# Patient Record
Sex: Female | Born: 1960 | Race: White | Hispanic: No | Marital: Single | Smoking: Former smoker
Health system: Southern US, Community
[De-identification: ages and names within clinical notes are randomized; demographics above are authoritative.]

## PROBLEM LIST (undated history)

## (undated) DIAGNOSIS — M5090 Cervical disc disorder, unspecified, unspecified cervical region: Secondary | ICD-10-CM

## (undated) HISTORY — PX: ANAL FISSURE REPAIR: SHX2312

## (undated) HISTORY — PX: NECK SURGERY: SHX720

## (undated) HISTORY — PX: TONSILLECTOMY: SUR1361

---

## 2011-03-11 ENCOUNTER — Ambulatory Visit: Payer: Self-pay

## 2012-01-28 ENCOUNTER — Ambulatory Visit: Payer: Self-pay

## 2012-02-11 ENCOUNTER — Ambulatory Visit: Payer: Self-pay

## 2012-09-28 ENCOUNTER — Ambulatory Visit: Payer: Self-pay | Admitting: Family Medicine

## 2013-07-15 ENCOUNTER — Ambulatory Visit: Payer: Self-pay | Admitting: Physician Assistant

## 2015-01-06 ENCOUNTER — Ambulatory Visit
Admission: EM | Admit: 2015-01-06 | Discharge: 2015-01-06 | Disposition: A | Discharge status: Home / Self Care | Payer: Medicare Other | Attending: Internal Medicine | Admitting: Internal Medicine

## 2015-01-06 ENCOUNTER — Encounter: Payer: Self-pay | Admitting: Emergency Medicine

## 2015-01-06 DIAGNOSIS — Z825 Family history of asthma and other chronic lower respiratory diseases: Secondary | ICD-10-CM

## 2015-01-06 DIAGNOSIS — J209 Acute bronchitis, unspecified: Secondary | ICD-10-CM

## 2015-01-06 DIAGNOSIS — F17200 Nicotine dependence, unspecified, uncomplicated: Secondary | ICD-10-CM

## 2015-01-06 DIAGNOSIS — H6693 Otitis media, unspecified, bilateral: Secondary | ICD-10-CM

## 2015-01-06 HISTORY — DX: Cervical disc disorder, unspecified, unspecified cervical region: M50.90

## 2015-01-06 MED ORDER — ALBUTEROL SULFATE HFA 108 (90 BASE) MCG/ACT IN AERS: 1.0000 | INHALATION_SPRAY | Freq: Four times a day (QID) | RESPIRATORY_TRACT | Status: DC | PRN

## 2015-01-06 MED ORDER — IPRATROPIUM-ALBUTEROL 0.5-2.5 (3) MG/3ML IN SOLN
3.0000 mL | Freq: Once | RESPIRATORY_TRACT | Status: AC
  Administered 2015-01-06: 3 mL via RESPIRATORY_TRACT

## 2015-01-06 MED ORDER — CEFDINIR 300 MG PO CAPS: 300.0000 mg | ORAL_CAPSULE | Freq: Two times a day (BID) | ORAL | Status: DC

## 2015-01-06 MED ORDER — PREDNISONE 50 MG PO TABS: 50.0000 mg | ORAL_TABLET | Freq: Every day | ORAL | Status: DC

## 2015-01-06 NOTE — ED Provider Notes (Signed)
CSN: [id number]     Arrival date & time 01/06/15  1756 History   First MD Initiated Contact with Patient 01/06/15 1840     Chief Complaint  Patient presents with  . URI   HPI Patient is a 54 year old lady presents today with about a 12 day history of respiratory symptoms. She started with profuse runny nose, some sinus congestion. In the last couple of days, she has developed chest tightness and wheezing. Tactile temp, but no recorded fever. Malaise. She has tried to quit smoking multiple times. Her sister was diagnosed yesterday with emphysema.  Past Medical History  Diagnosis Date  . Cervical back pain with evidence of disc disease    Past Surgical History  Procedure Laterality Date  . Tonsillectomy    . Cesarean section    . Anal fissure repair    . Neck surgery     Family History  Problem Relation Age of Onset  . Cancer Mother   . Cancer Father    Social History  Substance Use Topics  . Smoking status: Current Every Day Smoker  . Smokeless tobacco: Never Used  . Alcohol Use: No   OB History    No data available     Review of Systems  Allergies  Review of patient's allergies indicates no known allergies.  Home Medications   Prior to Admission medications   Medication Sig Start Date End Date Taking? Authorizing Provider  baclofen (LIORESAL) 10 MG tablet Take 10 mg by mouth 2 (two) times daily.   Yes Historical Provider, MD  HYDROcodone-acetaminophen (NORCO) 10-325 MG per tablet Take 1 tablet by mouth every 4 (four) hours as needed.   Yes Historical Provider, MD  ibuprofen (ADVIL,MOTRIN) 600 MG tablet Take 600 mg by mouth every 6 (six) hours as needed.   Yes Historical Provider, MD  ibuprofen (ADVIL,MOTRIN) 800 MG tablet Take 800 mg by mouth every 8 (eight) hours as needed.   Yes Historical Provider, MD  morphine (MS CONTIN) 30 MG 12 hr tablet Take 30 mg by mouth every 12 (twelve) hours.   Yes Historical Provider, MD   BP 119/73 mmHg  Pulse 87  Temp(Src) 98.9 F  (37.2 C) (Tympanic)  Resp 18  Ht 5\' 6"  (1.676 m)  Wt 127 lb (57.607 kg)  BMI 20.51 kg/m2  SpO2 95% Physical Exam  Constitutional: She is oriented to person, place, and time. No distress.  Alert, nicely groomed  HENT:  Head: Atraumatic.  Bilateral TMs are red, dull Moderate nasal congestion Throat is slightly red  Eyes:  Conjugate gaze, no eye redness/drainage  Neck: Neck supple.  Cardiovascular: Normal rate and regular rhythm.   Pulmonary/Chest: No respiratory distress. She has wheezes. She has no rales.  Coarse breath sounds throughout, symmetric, with coarse wheezes throughout.  Abdominal: She exhibits no distension.  Musculoskeletal: Normal range of motion.  No leg swelling  Neurological: She is alert and oriented to person, place, and time.  Skin: Skin is warm and dry.  No cyanosis  Nursing note and vitals reviewed.   ED Course  Procedures  Medications  ipratropium-albuterol (DUONEB) 0.5-2.5 (3) MG/3ML nebulizer solution 3 mL (3 mLs Nebulization Given 01/06/15 1901)       MDM   1. Acute bronchitis, unspecified organism   2. Bilateral otitis media, recurrence not specified, unspecified chronicity, unspecified otitis media type   3. Smoker   4. Family history of emphysema    We discussed chest x-ray now, versus chest x-ray if not improving after  treatment that would be effective for bronchitis or pneumonia. She has had multiple chest x-rays, most recently in the last 6 months.  Prescriptions as below sent to the pharmacy. Patient will recheck if not improving, for chest x-ray.  Discharge Medication List as of 01/06/2015  7:19 PM    START taking these medications   Details  albuterol (PROVENTIL HFA;VENTOLIN HFA) 108 (90 BASE) MCG/ACT inhaler Inhale 1-2 puffs into the lungs every 6 (six) hours as needed for wheezing or shortness of breath., Starting 01/06/2015, Until Discontinued, Normal    cefdinir (OMNICEF) 300 MG capsule Take 1 capsule (300 mg total) by mouth 2  (two) times daily., Starting 01/06/2015, Until Discontinued, Normal    predniSONE (DELTASONE) 50 MG tablet Take 1 tablet (50 mg total) by mouth daily., Starting 01/06/2015, Until Discontinued, Normal          Eustace Moore, MD 01/07/15 2130

## 2015-01-06 NOTE — Discharge Instructions (Signed)
Prescriptions for prednisone, omnicef (cefdinir, an antibiotic), and  albuterol inhaler, were sent to the [facility] in [place]. Recheck if not starting to improve in 2-3 days, for new fever >100.5, or increasing phlegm.   Anticipate gradual improvement over several days. Some cough may persist for another 2-3 weeks.  Acute Bronchitis Bronchitis is when the airways that extend from the windpipe into the lungs get red, puffy, and painful (inflamed). Bronchitis often causes thick spit (mucus) to develop. This leads to a cough. A cough is the most common symptom of bronchitis. In acute bronchitis, the condition usually begins suddenly and goes away over time (usually in 2 weeks). Smoking, allergies, and asthma can make bronchitis worse. Repeated episodes of bronchitis may cause more lung problems. HOME CARE  Rest.  Drink enough fluids to keep your pee (urine) clear or pale yellow (unless you need to limit fluids as told by your doctor).  Only take over-the-counter or prescription medicines as told by your doctor.  Avoid smoking and secondhand smoke. These can make bronchitis worse. If you are a smoker, think about using nicotine gum or skin patches. Quitting smoking will help your lungs heal faster.  Reduce the chance of getting bronchitis again by:  Washing your hands often.  Avoiding people with cold symptoms.  Trying not to touch your hands to your mouth, nose, or eyes.  Follow up with your doctor as told. GET HELP IF: Your symptoms do not improve after 1 week of treatment. Symptoms include:  Cough.  Fever.  Coughing up thick spit.  Body aches.  Chest congestion.  Chills.  Shortness of breath.  Sore throat. GET HELP RIGHT AWAY IF:   You have an increased fever.  You have chills.  You have severe shortness of breath.  You have bloody thick spit (sputum).  You throw up (vomit) often.  You lose too much body fluid (dehydration).  You have a severe headache.  You  faint. MAKE SURE YOU:   Understand these instructions.  Will watch your condition.  Will get help right away if you are not doing well or get worse. Document Released: 11/01/2007 Document Revised: 01/15/2013 Document Reviewed: 11/05/2012 [facility] Patient Information 2015 [place], [place]. This information is not intended to replace advice given to you by your health care provider. Make sure you discuss any questions you have with your health care provider.

## 2015-01-06 NOTE — ED Notes (Signed)
Cough, chest congestion, bodyaches on and off for 12 days

## 2017-09-30 ENCOUNTER — Encounter: Payer: Self-pay | Admitting: Gynecology

## 2017-09-30 ENCOUNTER — Ambulatory Visit
Admission: EM | Admit: 2017-09-30 | Discharge: 2017-09-30 | Disposition: A | Discharge status: Home / Self Care | Payer: Medicare Other | Attending: Family Medicine | Admitting: Family Medicine

## 2017-09-30 ENCOUNTER — Ambulatory Visit (INDEPENDENT_AMBULATORY_CARE_PROVIDER_SITE_OTHER): Payer: Medicare Other

## 2017-09-30 DIAGNOSIS — R05 Cough: Secondary | ICD-10-CM

## 2017-09-30 DIAGNOSIS — J189 Pneumonia, unspecified organism: Secondary | ICD-10-CM

## 2017-09-30 DIAGNOSIS — J181 Lobar pneumonia, unspecified organism: Secondary | ICD-10-CM | POA: Diagnosis not present

## 2017-09-30 DIAGNOSIS — J9801 Acute bronchospasm: Secondary | ICD-10-CM

## 2017-09-30 MED ORDER — ALBUTEROL SULFATE HFA 108 (90 BASE) MCG/ACT IN AERS: 1.0000 | INHALATION_SPRAY | Freq: Four times a day (QID) | RESPIRATORY_TRACT | 0 refills | Status: DC | PRN

## 2017-09-30 MED ORDER — IPRATROPIUM-ALBUTEROL 0.5-2.5 (3) MG/3ML IN SOLN
3.0000 mL | Freq: Once | RESPIRATORY_TRACT | Status: AC
  Administered 2017-09-30: 3 mL via RESPIRATORY_TRACT

## 2017-09-30 MED ORDER — LEVOFLOXACIN 750 MG PO TABS: 750.0000 mg | ORAL_TABLET | Freq: Every day | ORAL | 0 refills | Status: DC

## 2017-09-30 NOTE — Discharge Instructions (Signed)
Follow up with PCP this week Go to Emergency Department if symptoms worsen

## 2017-09-30 NOTE — ED Triage Notes (Signed)
Chest congestion since Friday, states she is prone to pneumonia. Productive cough with yellow/brown mucus, chest pain in the center of her chest and said she has coughed so much she has pulled a muscle in her back. Did try alka seltzer cough and cold along with ibuprofen.

## 2017-09-30 NOTE — ED Provider Notes (Addendum)
[facility]    CSN: [id number] Arrival date & time: 09/30/17  1313     History   Chief Complaint Chief Complaint  Patient presents with  . URI    HPI Savannah Mccarthy is a 57 y.o. female.   The history is provided by the patient.  URI  Presenting symptoms: congestion, cough, fatigue and rhinorrhea   Severity:  Moderate Onset quality:  Sudden Duration:  1 week Timing:  Constant Progression:  Worsening Chronicity:  New Relieved by:  Nothing Ineffective treatments:  OTC medications Associated symptoms: no wheezing   Associated symptoms comment:  Denies shortness of breath Risk factors: chronic respiratory disease (patient states her oxygen normally runs 92-93%)     Past Medical History:  Diagnosis Date  . Cervical back pain with evidence of disc disease     There are no active problems to display for this patient.   Past Surgical History:  Procedure Laterality Date  . ANAL FISSURE REPAIR    . CESAREAN SECTION    . NECK SURGERY    . TONSILLECTOMY      OB History   None      Home Medications    Prior to Admission medications   Medication Sig Start Date End Date Taking? Authorizing Provider  baclofen (LIORESAL) 10 MG tablet Take 10 mg by mouth 2 (two) times daily.   Yes [provider]  ibuprofen (ADVIL,MOTRIN) 600 MG tablet Take 600 mg by mouth every 6 (six) hours as needed.   Yes [provider]  morphine (MS CONTIN) 30 MG 12 hr tablet Take 30 mg by mouth every 12 (twelve) hours.   Yes [provider]  albuterol (PROVENTIL HFA;VENTOLIN HFA) 108 (90 Base) MCG/ACT inhaler Inhale 1-2 puffs into the lungs every 6 (six) hours as needed for wheezing or shortness of breath. 09/30/17   Payton Mccallum, MD  cefdinir (OMNICEF) 300 MG capsule Take 1 capsule (300 mg total) by mouth 2 (two) times daily. 01/06/15   Isa Rankin, MD  HYDROcodone-acetaminophen [facility]) 10-325 MG per tablet Take 1 tablet by mouth every 4 (four)  hours as needed.    [provider]  ibuprofen (ADVIL,MOTRIN) 800 MG tablet Take 800 mg by mouth every 8 (eight) hours as needed.    [provider]  levofloxacin (LEVAQUIN) 750 MG tablet Take 1 tablet (750 mg total) by mouth daily. 09/30/17   Payton Mccallum, MD  oxyCODONE-acetaminophen (PERCOCET) 10-325 MG tablet Take by mouth.    [provider]  predniSONE (DELTASONE) 50 MG tablet Take 1 tablet (50 mg total) by mouth daily. 01/06/15   Isa Rankin, MD    Family History Family History  Problem Relation Age of Onset  . Cancer Mother   . Cancer Father     Social History Social History   Tobacco Use  . Smoking status: Current Every Day Smoker  . Smokeless tobacco: Never Used  Substance Use Topics  . Alcohol use: No  . Drug use: No     Allergies   Patient has no known allergies.   Review of Systems Review of Systems  Constitutional: Positive for fatigue.  HENT: Positive for congestion and rhinorrhea.   Respiratory: Positive for cough. Negative for wheezing.      Physical Exam Triage Vital Signs ED Triage Vitals  Enc Vitals Group     BP 09/30/17 1332 113/84     Pulse Rate 09/30/17 1332 74     Resp 09/30/17 1332 18  Temp 09/30/17 1332 98 F (36.7 C)     Temp Source 09/30/17 1332 Oral     SpO2 09/30/17 1332 91 %     Weight --      Height --      Head Circumference --      Peak Flow --      Pain Score 09/30/17 1335 5     Pain Loc --      Pain Edu? --      Excl. in GC? --    No data found.  Updated Vital Signs BP 113/84 (BP Location: Left Arm)   Pulse 74   Temp 98 F (36.7 C) (Oral)   Resp 18   SpO2 91%   Visual Acuity Right Eye Distance:   Left Eye Distance:   Bilateral Distance:    Right Eye Near:   Left Eye Near:    Bilateral Near:     Physical Exam  Constitutional: She appears well-developed and well-nourished. No distress.  HENT:  Head: Normocephalic and atraumatic.  Right Ear: Tympanic membrane,  external ear and ear canal normal.  Left Ear: Tympanic membrane, external ear and ear canal normal.  Nose: Mucosal edema and rhinorrhea present. No nose lacerations, sinus tenderness, nasal deformity, septal deviation or nasal septal hematoma. No epistaxis.  No foreign bodies. Right sinus exhibits no maxillary sinus tenderness and no frontal sinus tenderness. Left sinus exhibits no maxillary sinus tenderness and no frontal sinus tenderness.  Mouth/Throat: Uvula is midline, oropharynx is clear and moist and mucous membranes are normal. No oropharyngeal exudate.  Eyes: Conjunctivae are normal. Right eye exhibits no discharge. Left eye exhibits no discharge. No scleral icterus.  Neck: Normal range of motion. Neck supple. No thyromegaly present.  Cardiovascular: Normal rate, regular rhythm and normal heart sounds.  Pulmonary/Chest: Effort normal. No stridor. No respiratory distress. She has no wheezes. She has rales (right base).  Lymphadenopathy:    She has no cervical adenopathy.  Skin: She is not diaphoretic.  Nursing note and vitals reviewed.    UC Treatments / Results  Labs (all labs ordered are listed, but only abnormal results are displayed) Labs Reviewed - No data to display  EKG None  Radiology Dg Chest 2 View  Result Date: 09/30/2017 CLINICAL DATA:  Cough EXAM: CHEST - 2 VIEW COMPARISON:  07/15/2013 chest radiograph. FINDINGS: Surgical hardware from ACDF overlies the lower cervical spine. Stable cardiomediastinal silhouette with normal heart size. No pneumothorax. No pleural effusion. Hyperinflated lungs. No pulmonary edema. Mild patchy opacity at the posterior right costophrenic angle appears new. IMPRESSION: 1. Hyperinflated lungs, suggesting COPD. 2. Mild patchy opacity at the posterior right costophrenic angle appears new, cannot exclude mild pneumonia. Recommend follow-up PA and lateral post treatment chest radiographs in 4-6 weeks. Electronically Signed   By: Delbert Phenix M.D.    On: 09/30/2017 14:36    Procedures Procedures (including critical care time)  Medications Ordered in UC Medications  ipratropium-albuterol (DUONEB) 0.5-2.5 (3) MG/3ML nebulizer solution 3 mL (3 mLs Nebulization Given 09/30/17 1355)    Initial Impression / Assessment and Plan / UC Course  I have reviewed the triage vital signs and the nursing notes.  Pertinent labs & imaging results that were available during my care of the patient were reviewed by me and considered in my medical decision making (see chart for details).      Final Clinical Impressions(s) / UC Diagnoses   Final diagnoses:  Pneumonia of right lower lobe due to infectious organism (HCC)  Bronchospasm     Discharge Instructions     Follow up with PCP this week Go to Emergency Department if symptoms worsen    ED Prescriptions    Medication Sig Dispense Auth. Provider   levofloxacin (LEVAQUIN) 750 MG tablet Take 1 tablet (750 mg total) by mouth daily. 5 tablet Payton Mccallum, MD   albuterol (PROVENTIL HFA;VENTOLIN HFA) 108 (90 Base) MCG/ACT inhaler Inhale 1-2 puffs into the lungs every 6 (six) hours as needed for wheezing or shortness of breath. 1 Inhaler Payton Mccallum, MD     1. x-ray results and diagnosis reviewed with patient; discussed with patient consideration for inpatient hospital treatment, however patient not agreeable to this 2. Patient given duoneb x 1  3.  rx as per orders above; reviewed possible side effects, interactions, risks and benefits  4. Recommend close follow up tomorrow with PCP for recheck, however if symptoms worsen prior then go to Emergency Department or call 911 5. Follow-up prn if symptoms worsen or don't improve   Controlled Substance Prescriptions [place] Controlled Substance Registry consulted? Not Applicable   Payton Mccallum, MD 09/30/17 1525    Payton Mccallum, MD 09/30/17 [phone]

## 2018-07-18 ENCOUNTER — Encounter: Payer: Self-pay | Admitting: Obstetrics and Gynecology

## 2018-07-18 ENCOUNTER — Ambulatory Visit: Payer: Medicare Other | Admitting: Obstetrics and Gynecology

## 2018-07-18 VITALS — BP 113/71 | HR 81 | Ht 66.5 in

## 2018-07-18 DIAGNOSIS — K4090 Unilateral inguinal hernia, without obstruction or gangrene, not specified as recurrent: Secondary | ICD-10-CM | POA: Diagnosis not present

## 2018-07-18 DIAGNOSIS — R1030 Lower abdominal pain, unspecified: Secondary | ICD-10-CM

## 2018-07-18 NOTE — Progress Notes (Signed)
Patient comes in today for new GYN appointment. She is having lower abdominal and pelvic pain.

## 2018-07-18 NOTE — Progress Notes (Signed)
HPI:      [name] is a 58 y.o. No obstetric history on file. who LMP was No LMP recorded. Patient is postmenopausal.  Subjective:   She presents today with complaint of a year long history of pelvic/abdominal pain.  She has undergone a fairly complete work-up to date with no obvious abnormalities found.  Previous CT and pelvic ultrasound were both negative for problem.  Patient underwent colonoscopy which was also negative. Her main complaint is of worsening pain in the right lower quadrant.  She states this pain is sometimes worse in the evening.  She was previously having some midline abdominal discomfort which was intermittent but this is not the main issue with her pain. Patient states that she is generally not sexually active-has had intercourse twice in the last year.  She does complain of vaginal dryness and soreness with intercourse but is not currently interested in any form of HRT. She denies prolapse symptoms-her children were born by cesarean.    Hx: The following portions of the patient's history were reviewed and updated as appropriate:             She  has a past medical history of Cervical back pain with evidence of disc disease. She does not have a problem list on file. She  has a past surgical history that includes Tonsillectomy; Cesarean section; Anal fissure repair; and Neck surgery. Her family history includes Cancer in her father and mother. She  reports that she has quit smoking. She has never used smokeless tobacco. She reports current drug use. Drug: Morphine. She reports that she does not drink alcohol. She has a current medication list which includes the following prescription(s): baclofen, ibuprofen, morphine, oxycodone-acetaminophen, albuterol, cefdinir, hydrocodone-acetaminophen, ibuprofen, levofloxacin, and prednisone. She has No Known Allergies.       Review of Systems:  Review of Systems  Constitutional: Denied constitutional symptoms, night  sweats, recent illness, fatigue, fever, insomnia and weight loss.  Eyes: Denied eye symptoms, eye pain, photophobia, vision change and visual disturbance.  Ears/Nose/Throat/Neck: Denied ear, nose, throat or neck symptoms, hearing loss, nasal discharge, sinus congestion and sore throat.  Cardiovascular: Denied cardiovascular symptoms, arrhythmia, chest pain/pressure, edema, exercise intolerance, orthopnea and palpitations.  Respiratory: Denied pulmonary symptoms, asthma, pleuritic pain, productive sputum, cough, dyspnea and wheezing.  Gastrointestinal: Denied, gastro-esophageal reflux, melena, nausea and vomiting.  Genitourinary: Denied genitourinary symptoms including symptomatic vaginal discharge, pelvic relaxation issues, and urinary complaints.  Musculoskeletal: Denied musculoskeletal symptoms, stiffness, swelling, muscle weakness and myalgia.  Dermatologic: Denied dermatology symptoms, rash and scar.  Neurologic: Denied neurology symptoms, dizziness, headache, neck pain and syncope.  Psychiatric: Denied psychiatric symptoms, anxiety and depression.  Endocrine: Denied endocrine symptoms including hot flashes and night sweats.   Meds:   Current Outpatient Medications on File Prior to Visit  Medication Sig Dispense Refill  . baclofen (LIORESAL) 10 MG tablet Take 10 mg by mouth 2 (two) times daily.    Marland Kitchen ibuprofen (ADVIL,MOTRIN) 600 MG tablet Take 600 mg by mouth every 6 (six) hours as needed.    Marland Kitchen morphine (MS CONTIN) 30 MG 12 hr tablet Take 30 mg by mouth every 12 (twelve) hours.    Marland Kitchen oxyCODONE-acetaminophen (PERCOCET) 10-325 MG tablet Take by mouth.    Marland Kitchen albuterol (PROVENTIL HFA;VENTOLIN HFA) 108 (90 Base) MCG/ACT inhaler Inhale 1-2 puffs into the lungs every 6 (six) hours as needed for wheezing or shortness of breath. (Patient not taking: Reported on 07/18/2018) 1 Inhaler 0  . cefdinir (OMNICEF)  300 MG capsule Take 1 capsule (300 mg total) by mouth 2 (two) times daily. (Patient not taking:  Reported on 07/18/2018) 20 capsule 0  . HYDROcodone-acetaminophen (NORCO) 10-325 MG per tablet Take 1 tablet by mouth every 4 (four) hours as needed.    Marland Kitchen ibuprofen (ADVIL,MOTRIN) 800 MG tablet Take 800 mg by mouth every 8 (eight) hours as needed.    Marland Kitchen levofloxacin (LEVAQUIN) 750 MG tablet Take 1 tablet (750 mg total) by mouth daily. (Patient not taking: Reported on 07/18/2018) 5 tablet 0  . predniSONE (DELTASONE) 50 MG tablet Take 1 tablet (50 mg total) by mouth daily. (Patient not taking: Reported on 07/18/2018) 5 tablet 0   No current facility-administered medications on file prior to visit.     Objective:     Vitals:   07/18/18 1005  BP: 113/71  Pulse: 81              Abdominal examination reveals pain localized to the abdominal wall in the right lower quadrant.  Pain is worse with palpation and Valsalva maneuver.  Pain is definitely localized to the abdominal wall and not to internal viscera. Ultrasound and CT reviewed  Assessment:    No obstetric history on file. There are no active problems to display for this patient.    1. Non-recurrent unilateral inguinal hernia without obstruction or gangrene   2. Lower abdominal pain     Suspect inguinal hernia on the right.   Plan:            1.  Refer to general surgery for further work-up. Orders No orders of the defined types were placed in this encounter.   No orders of the defined types were placed in this encounter.     F/U  Return for Pt to contact us if symptoms worsen. I spent 34 minutes involved in the care of this patient of which greater than 50% was spent discussing her history of pain and work-up to date including ultrasound CAT scan colonoscopy etc.  Characterization of her pain.  Timing of menopause, vaginal issues secondary to menopause, bowel issues, abdominal wall pain at cesarean scar as well as inguinal area.  All questions answered.  Elonda Husky, M.D. 07/18/2018 11:47 AM

## 2019-05-01 IMAGING — CR DG CHEST 2V
2 series · 2 of 2 positions shown · non-contrast
Comparison: 07/15/2013 chest radiograph.

CLINICAL DATA: Cough

EXAM:
CHEST - 2 VIEW

[chest pa]
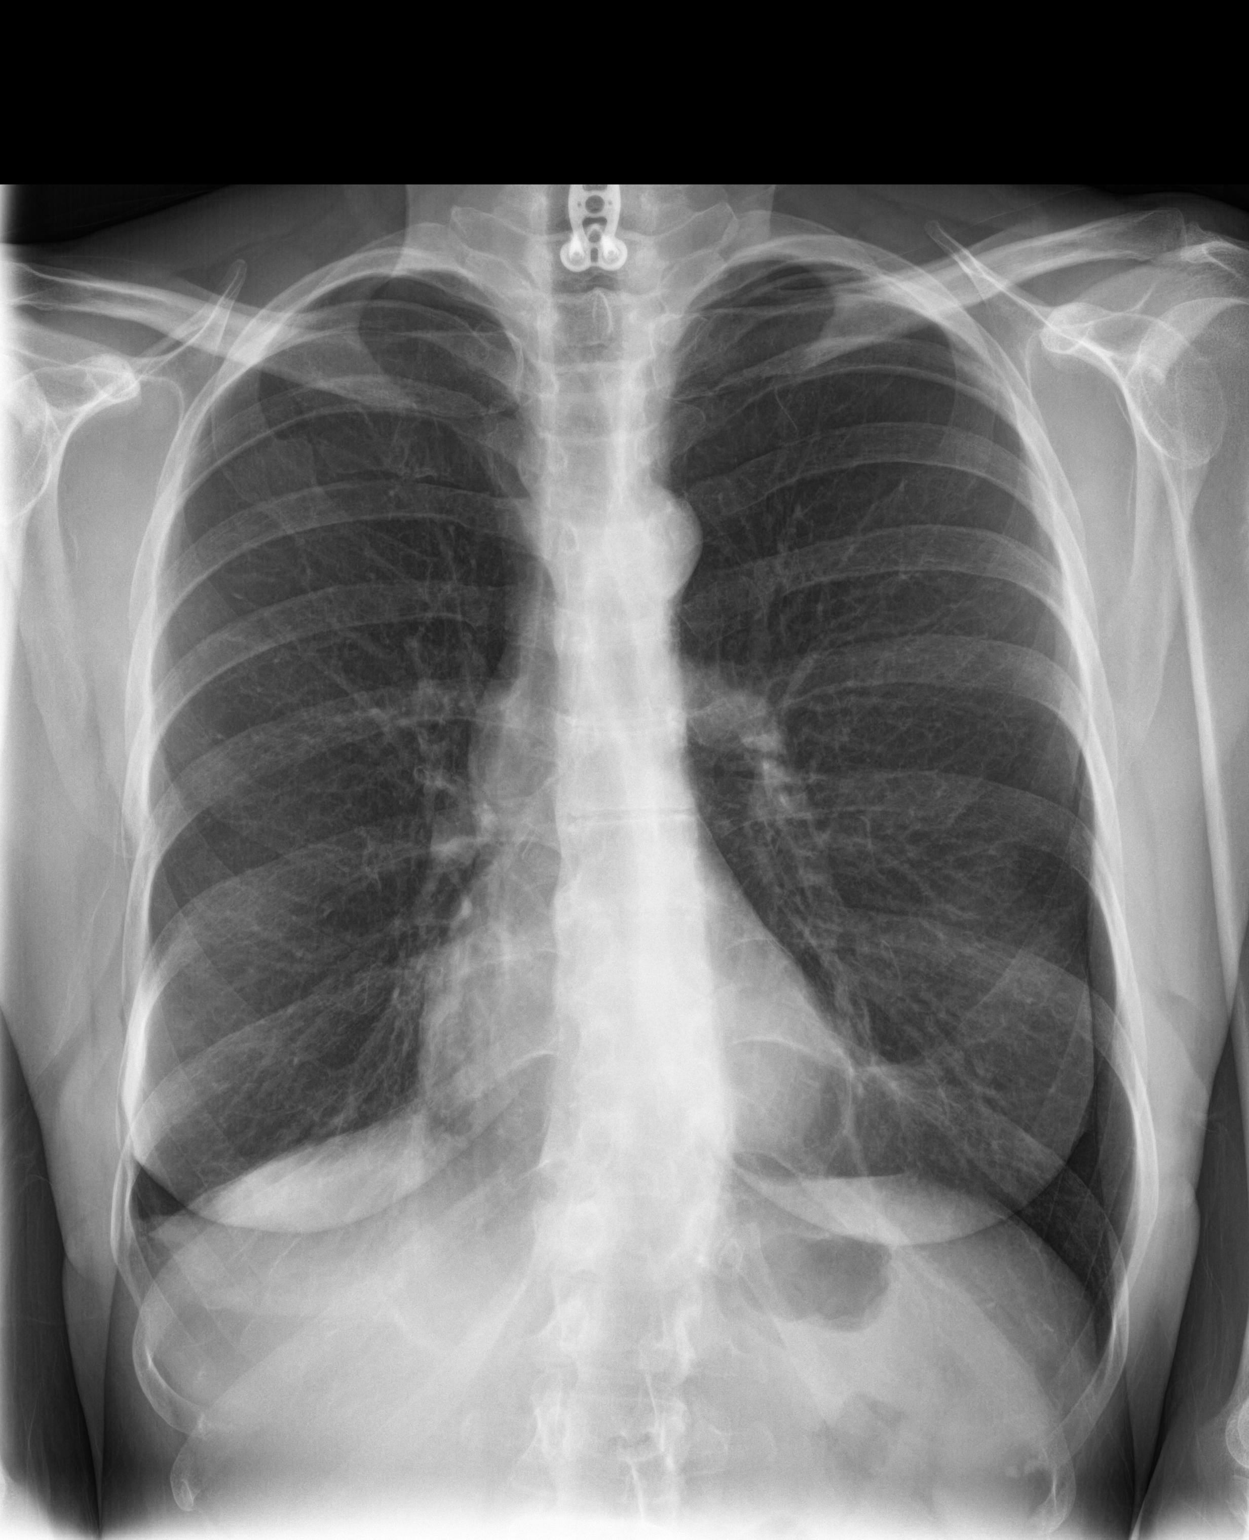

[chest lat]
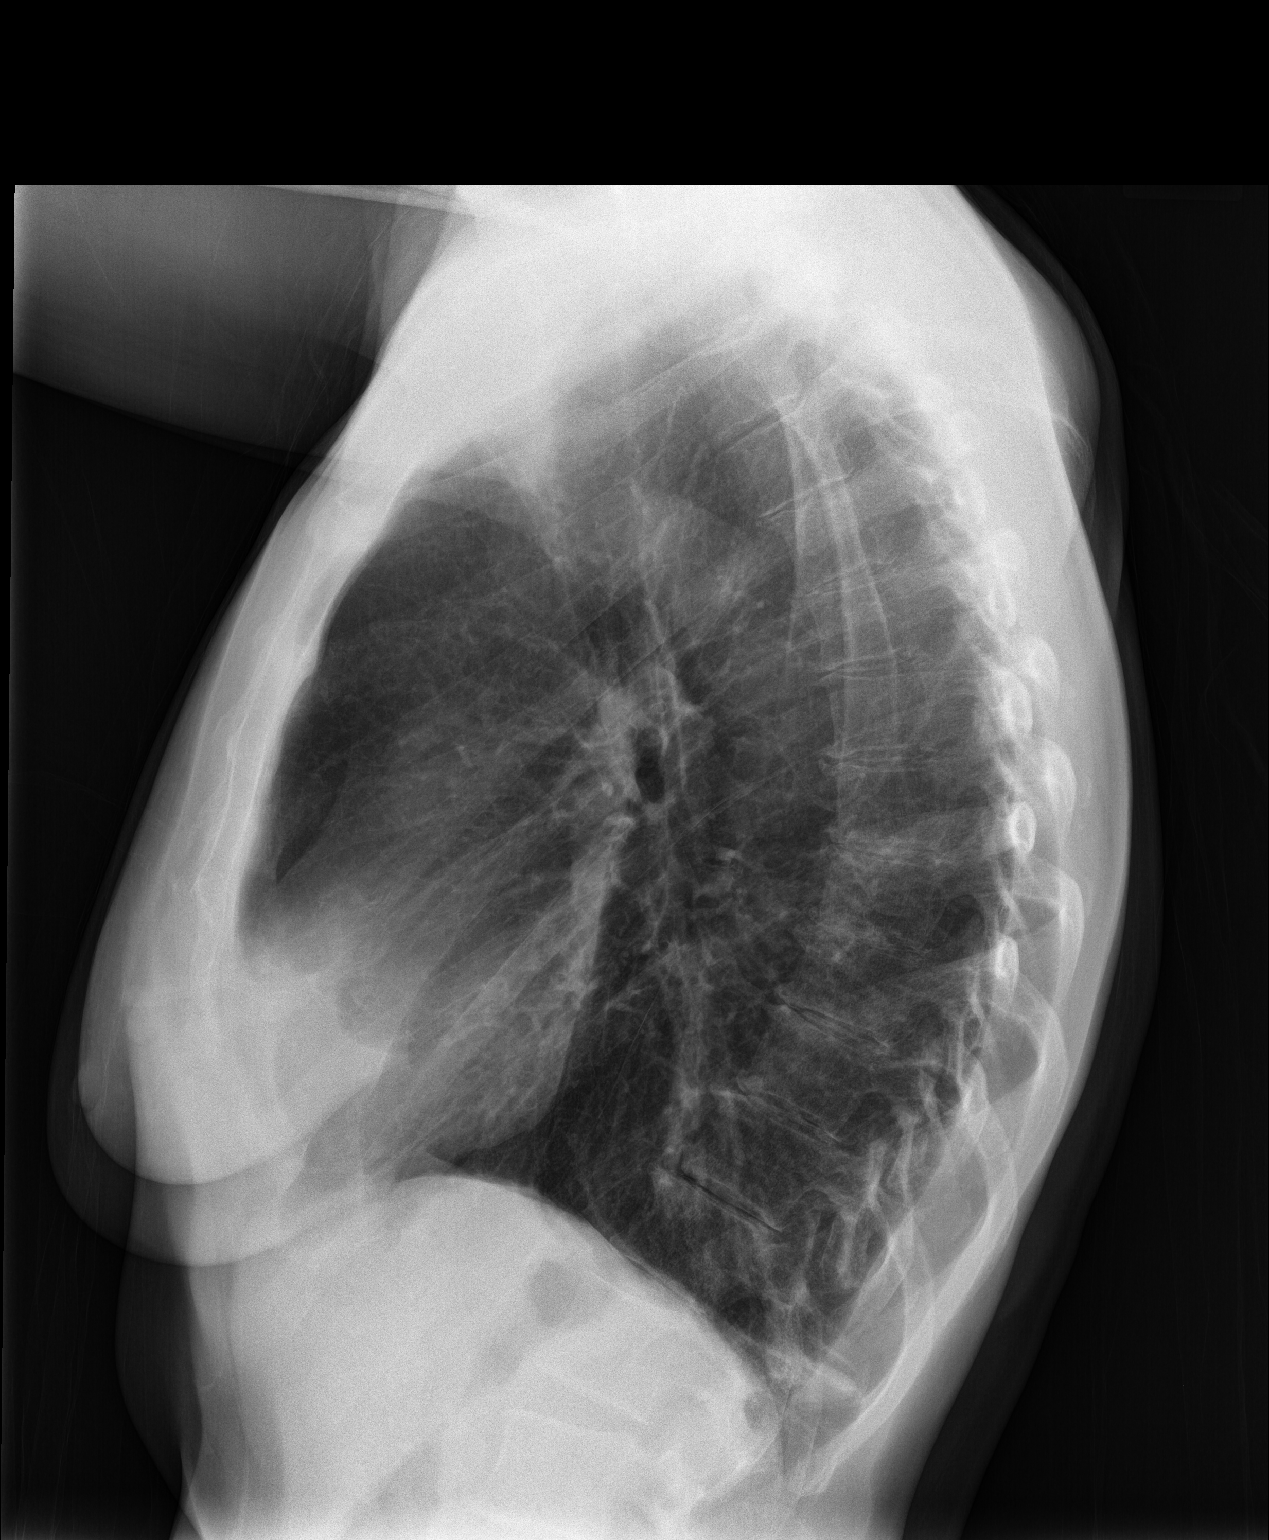

[2 of 2 positions shown; findings below may reference images not displayed]

FINDINGS: Surgical hardware from ACDF overlies the lower cervical spine.
Stable cardiomediastinal silhouette with normal heart size. No
pneumothorax. No pleural effusion. Hyperinflated lungs. No pulmonary
edema. Mild patchy opacity at the posterior right costophrenic angle
appears new.
IMPRESSION: 1. Hyperinflated lungs, suggesting COPD.
2. Mild patchy opacity at the posterior right costophrenic angle
appears new, cannot exclude mild pneumonia. Recommend follow-up PA
and lateral post treatment chest radiographs in 4-6 weeks.

## 2019-08-22 ENCOUNTER — Ambulatory Visit: Payer: Medicare Other | Attending: Internal Medicine

## 2019-08-22 ENCOUNTER — Other Ambulatory Visit: Payer: Self-pay

## 2019-08-22 DIAGNOSIS — Z23 Encounter for immunization: Secondary | ICD-10-CM

## 2019-08-22 NOTE — Progress Notes (Signed)
   Covid-19 Vaccination Clinic  Name:  [name]    MRN: [id number] DOB: 08/06/1960  08/22/2019  [name] was observed post Covid-19 immunization for 15 minutes without incident. She was provided with Vaccine Information Sheet and instruction to access the V-Safe system.   [name] was instructed to call 911 with any severe reactions post vaccine: Marland Kitchen Difficulty breathing  . Swelling of face and throat  . A fast heartbeat  . A bad rash all over body  . Dizziness and weakness   Immunizations Administered    Name Date Dose VIS Date Route   Pfizer COVID-19 Vaccine 08/22/2019 10:46 AM 0.3 mL 05/09/2019 Intramuscular   Manufacturer: [employer], [employer]   Lot: [id number]   NDC: [address]-3

## 2019-09-16 ENCOUNTER — Ambulatory Visit: Payer: Medicare Other

## 2019-09-17 ENCOUNTER — Ambulatory Visit: Payer: Medicare Other | Attending: Internal Medicine

## 2019-09-17 DIAGNOSIS — Z23 Encounter for immunization: Secondary | ICD-10-CM

## 2019-09-17 NOTE — Progress Notes (Signed)
   Covid-19 Vaccination Clinic  Name:  Savannah Mccarthy    MRN: [id number] DOB: August 03, 1960  09/17/2019  Savannah Mccarthy was observed post Covid-19 immunization for 15 minutes without incident. She was provided with Vaccine Information Sheet and instruction to access the V-Safe system.   Savannah Mccarthy was instructed to call 911 with any severe reactions post vaccine: Marland Kitchen Difficulty breathing  . Swelling of face and throat  . A fast heartbeat  . A bad rash all over body  . Dizziness and weakness   Immunizations Administered    Name Date Dose VIS Date Route   Pfizer COVID-19 Vaccine 09/17/2019 10:43 AM 0.3 mL 07/23/2018 Intramuscular   Manufacturer: [employer], [employer]   Lot: [id number]   NDC: [address]-5

## 2019-10-30 ENCOUNTER — Other Ambulatory Visit: Payer: Self-pay

## 2019-10-30 ENCOUNTER — Ambulatory Visit (INDEPENDENT_AMBULATORY_CARE_PROVIDER_SITE_OTHER): Payer: Medicare Other

## 2019-10-30 ENCOUNTER — Ambulatory Visit
Admission: EM | Admit: 2019-10-30 | Discharge: 2019-10-30 | Disposition: A | Discharge status: Home / Self Care | Payer: Medicare Other | Attending: Urgent Care | Admitting: Urgent Care

## 2019-10-30 DIAGNOSIS — M7042 Prepatellar bursitis, left knee: Secondary | ICD-10-CM

## 2019-10-30 MED ORDER — SULFAMETHOXAZOLE-TRIMETHOPRIM 800-160 MG PO TABS: 1.0000 | ORAL_TABLET | Freq: Two times a day (BID) | ORAL | 0 refills | Status: AC

## 2019-10-30 MED ORDER — PREDNISONE 10 MG (21) PO TBPK: ORAL_TABLET | Freq: Every day | ORAL | 0 refills | Status: DC

## 2019-10-30 MED ORDER — MELOXICAM 15 MG PO TABS: 15.0000 mg | ORAL_TABLET | Freq: Every day | ORAL | 0 refills | Status: DC

## 2019-10-30 NOTE — ED Triage Notes (Signed)
Pt presents with L knee pain, swelling and redness x 2-3 days.  Pt describes pain as a constant dull discomfort/ tightness.  Denies increased pain with ambulation or weight bearing. Warm to touch. Arthritis gel does take edge off.  Knee brace did not help.

## 2019-10-30 NOTE — Discharge Instructions (Addendum)
It was very nice seeing you today in clinic. Thank you for entrusting me with your care.   Rest, ice, and elevate your knee. Use medications as prescribed. Monitor for signs and symptoms of infection, which would include increased redness, swelling, streaking, drainage, pain, and the development of a fever.   Make arrangements to follow up with your regular doctor in 1 week for re-evaluation if not improving. If your symptoms/condition worsens, please seek follow up care either here or in the ER. Please remember, our [facility] providers are "right here with you" when you need [place].   Again, it was my pleasure to take care of you today. Thank you for choosing our clinic. I hope that you start to feel better quickly.   Quentin Mulling, MSN, APRN, FNP-C, CEN Advanced Practice Provider [facility] [place] [facility]

## 2019-10-31 NOTE — ED Provider Notes (Signed)
[place], [place]   Name: Savannah Mccarthy DOB: July 28, 1960 MRN: [id number] CSN: [id number] PCP: Etheleen Nicks, NP  Arrival date and time:  10/30/19 1600  Chief Complaint:  Knee Pain  NOTE: Prior to seeing the patient today, I have reviewed the triage nursing documentation and vital signs. Clinical staff has updated patient's PMH/PSHx, current medication list, and drug allergies/intolerances to ensure comprehensive history available to assist in medical decision making.   History:   HPI: Savannah Mccarthy is a 59 y.o. female who presents today with complaints of pain and swelling to her LEFT knee. Patient denies injury. Symptoms began with acute onset on Tuesday (10/28/2019) after patient was on her knees for an extended period of time cleaning toilets. She describes her pain as a constant "dull ache or soreness". Patient has not appreciated any areas of compromised skin integrity surrounding her knee. Patient denies fever, chills, or other systemic signs of infection; no hypotension, tachycardia, nausea, vomiting, or vertiginous symptoms. Patient has never had any significant injuries or surgeries on her knee. Patient is on COT for chronic neck and back issues. In efforts to control her symptoms, patient has used her prescribed oxycodone and MS-Contin, in addition to IBU and ice applications, however notes that her symptoms have persisted. She also has applied diclofenac gel, which has helped. Patient wearing a knee brace, however advises that it "does nothing" for her. Patient with plans to go out of town next week and wants to have current issues with her knee resolved by then. She notes FROM and distal sensation.   Past Medical History:  Diagnosis Date  . Cervical back pain with evidence of disc disease     Past Surgical History:  Procedure Laterality Date  . ANAL FISSURE REPAIR    . CESAREAN SECTION    . NECK SURGERY    . TONSILLECTOMY      Family History  Problem Relation Age of  Onset  . Cancer Mother   . Hypertension Mother   . COPD Mother   . Cancer Father     Social History   Tobacco Use  . Smoking status: Former Games developer  . Smokeless tobacco: Never Used  Substance Use Topics  . Alcohol use: No  . Drug use: Never    Types: Morphine    There are no problems to display for this patient.   Home Medications:    Current Meds  Medication Sig  . baclofen (LIORESAL) 10 MG tablet Take 10 mg by mouth 2 (two) times daily.  Marland Kitchen HYDROcodone-acetaminophen (NORCO) 10-325 MG per tablet Take 1 tablet by mouth every 4 (four) hours as needed.  Marland Kitchen ibuprofen (ADVIL,MOTRIN) 600 MG tablet Take 600 mg by mouth every 6 (six) hours as needed.  Marland Kitchen morphine (MS CONTIN) 30 MG 12 hr tablet Take 30 mg by mouth every 12 (twelve) hours.  Marland Kitchen oxyCODONE-acetaminophen (PERCOCET) 10-325 MG tablet Take by mouth.  . [DISCONTINUED] albuterol (PROVENTIL HFA;VENTOLIN HFA) 108 (90 Base) MCG/ACT inhaler Inhale 1-2 puffs into the lungs every 6 (six) hours as needed for wheezing or shortness of breath.    Allergies:   Patient has no known allergies.  Review of Systems (ROS):  Review of systems NEGATIVE unless otherwise noted in narrative H&P section.   Vital Signs: Today's Vitals   10/30/19 1625 10/30/19 1633 10/30/19 1801  BP:  131/90   Pulse:  98   Resp:  18   Temp:  98.8 F (37.1 C)   TempSrc:  Oral  SpO2:  98%   PainSc: 3   3     Physical Exam: Physical Exam  Constitutional: She is oriented to person, place, and time and well-developed, well-nourished, and in no distress.  HENT:  Head: Normocephalic and atraumatic.  Eyes: Pupils are equal, round, and reactive to light.  Cardiovascular: Normal rate, regular rhythm, normal heart sounds and intact distal pulses.  Pulmonary/Chest: Effort normal and breath sounds normal.  Musculoskeletal:     Left knee: Swelling, effusion and erythema present. No deformity or ecchymosis. Tenderness present. No LCL laxity.Normal alignment.      Comments: FROM in LEFT noted on exam. Knee with swollen, erythematous, and warm to the touch. Sensation WNL distally. No extremity weakness. Capillary refill WNL.   Lymphadenopathy:    She has no cervical adenopathy.  Neurological: She is alert and oriented to person, place, and time. She has normal sensation, normal strength and normal reflexes. Gait normal.  Skin: Skin is warm and dry. No rash noted. She is not diaphoretic.  Psychiatric: Memory, affect and judgment normal. Her mood appears anxious.  Nursing note and vitals reviewed.   Urgent Care Treatments / Results:   Orders Placed This Encounter  Procedures  . DG Knee Complete 4 Views Left    LABS: PLEASE NOTE: all labs that were ordered this encounter are listed, however only abnormal results are displayed. Labs Reviewed - No data to display  EKG: -None  RADIOLOGY: DG Knee Complete 4 Views Left  Result Date: 10/30/2019 CLINICAL DATA:  Acute atraumatic left knee pain. Redness and swelling for 2-3 days. EXAM: LEFT KNEE - COMPLETE 4+ VIEW COMPARISON:  None. FINDINGS: Normal alignment and joint spaces. No fracture, focal lesion, periosteal thickening or bony destruction. No evidence of focal bone lesion. Small knee joint effusion. There is prepatellar soft tissue edema and thickening. No soft tissue air. IMPRESSION: Prepatellar soft tissue edema and thickening, may represent cellulitis or prepatellar bursitis. Small knee joint effusion. No osseous abnormality. Electronically Signed   By: Narda Rutherford M.D.   On: 10/30/2019 17:41    PROCEDURES: Procedures  MEDICATIONS RECEIVED THIS VISIT: Medications - No data to display  PERTINENT CLINICAL COURSE NOTES/UPDATES:   Initial Impression / Assessment and Plan / Urgent Care Course:  Pertinent labs & imaging results that were available during my care of the patient were personally reviewed by me and considered in my medical decision making (see lab/imaging section of note for values  and interpretations).  Savannah Mccarthy is a 59 y.o. female who presents to [facility] today with complaints of Knee Pain  Patient is well appearing overall in clinic today. She does not appear to be in any acute distress. Presenting symptoms (see HPI) and exam as documented above.  Discussed differentials as being gout, cellulitis, septic arthritis, and prepatellar bursitis. Patient has no fevers or evidence of compromised skin integrity surrounding the knee. She has full ROM. All of these factors are reassuring. She wishes to avoid labs and evaluation in the emergency department if possible. Will proceed as follows:  Diagnostic radiographs of the LEFT knee reveals prepatellar soft tissue thickening. There appears to be no osseous involvement. There is no evidence of subcutaneous gas/air. Presenting symptoms, radiographs, and physical exam consistent with prepatellar bursitis. Treating with a 7 day course of SMZ-TMP, a systemic steroid (prednisone) taper, and meloxicam. Reviewed supportive care measures; rest, ice, elevation, and PRN use of APAP for discomfort. Patient advised to have a low threshold of pursuing further evaluation should  her symptoms worsen. Patient advised that worsening pain, reduced ROM, extending swelling/erythema, and/or the development of a fever would necessitate further evaluation in the emergency department setting. Discussed potential of current symptoms developing into septic arthritis, which will require treatment in the ER. Patient verbalizes understanding that development of worsening symptoms, which would indicate septic arthritis, should be treated as a medical emergency.   Discussed follow up with primary care physician in 1 week for re-evaluation. I have reviewed the follow up and strict return precautions for any new or worsening symptoms. Patient is aware of symptoms that would be deemed urgent/emergent, and would thus require further evaluation either here or  in the emergency department. At the time of discharge, she verbalized understanding and consent with the discharge plan as it was reviewed with her. All questions were fielded by provider and/or clinic staff prior to patient discharge.    Final Clinical Impressions / Urgent Care Diagnoses:   Final diagnoses:  Prepatellar bursitis of left knee    New Prescriptions:  [place] Controlled Substance Registry consulted? Yes, I have consulted the [place] Controlled Substances Registry for this patient, and feel the risk/benefit ratio today is not favorable for proceeding with this prescription for a controlled substance. Patient is on COT and has sufficient supplies of her oxycodone and MS-Contin at home.   Meds ordered this encounter  Medications  . sulfamethoxazole-trimethoprim (BACTRIM DS) 800-160 MG tablet    Sig: Take 1 tablet by mouth 2 (two) times daily for 7 days.    Dispense:  14 tablet    Refill:  0  . predniSONE (STERAPRED UNI-PAK 21 TAB) 10 MG (21) TBPK tablet    Sig: Take by mouth daily. 60 mg x 1 day, 50 mg x 1 day, 40 mg x 1 day, 30 mg x 1 day, 20 mg x 1 day, 10 mg x 1 day    Dispense:  21 tablet    Refill:  0  . meloxicam (MOBIC) 15 MG tablet    Sig: Take 1 tablet (15 mg total) by mouth daily.    Dispense:  30 tablet    Refill:  0    Recommended Follow up Care:  Patient encouraged to follow up with the following provider within the specified time frame, or sooner as dictated by the severity of her symptoms. As always, she was instructed that for any urgent/emergent care needs, she should seek care either here or in the emergency department for more immediate evaluation.  Follow-up Information    Verita Lamb, NP In 1 week.   Why: General reassessment of symptoms if not improving Contact information: [address] [place] [address] [phone]         NOTE: This note was prepared using Dragon dictation software along with smaller phrase technology. Despite my best ability  to proofread, there is the potential that transcriptional errors may still occur from this process, and are completely unintentional.    Karen Kitchens, NP 10/31/19 1843

## 2021-05-30 IMAGING — CR DG KNEE COMPLETE 4+V*L*
4 series · 4 of 4 positions shown · non-contrast
Comparison: None.

CLINICAL DATA: Acute atraumatic left knee pain. Redness and
swelling for 2-3 days.

EXAM:
LEFT KNEE - COMPLETE 4+ VIEW

[knee lat]
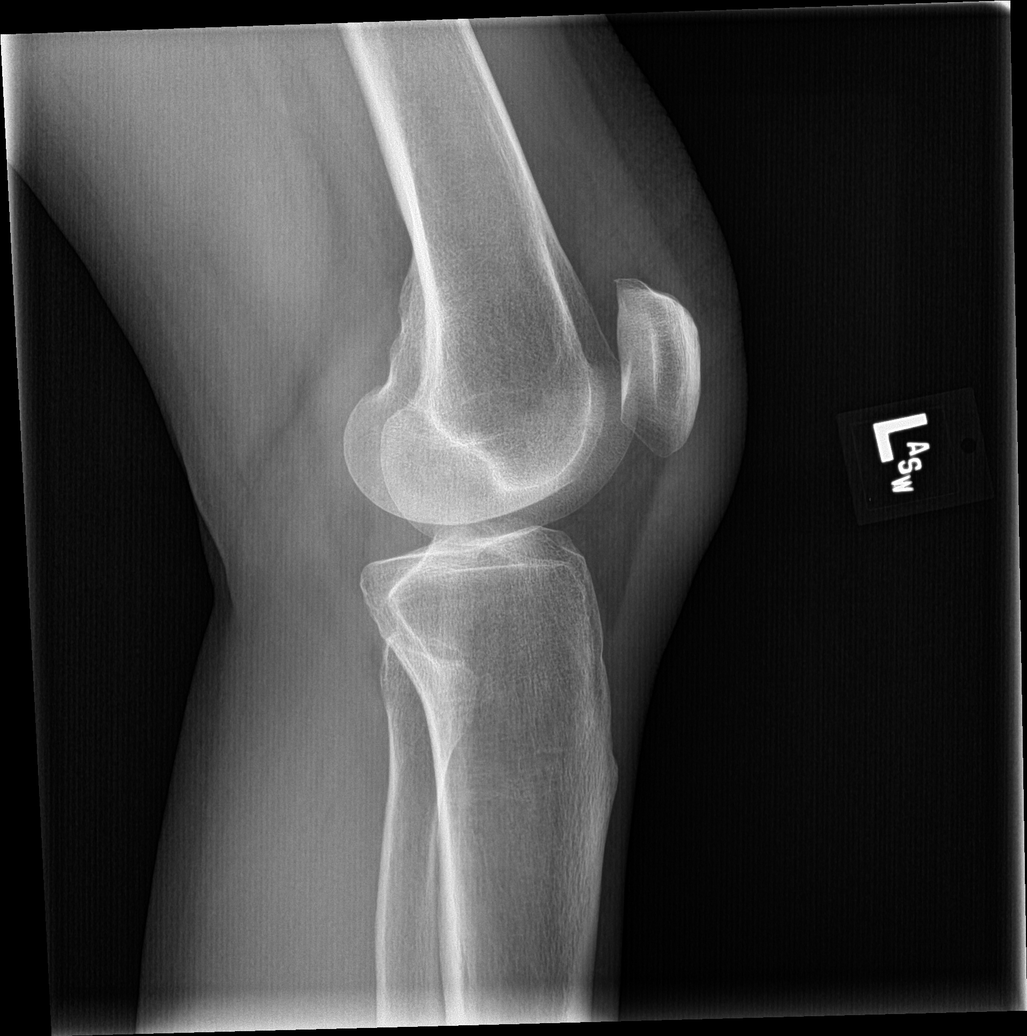

[tunnel]
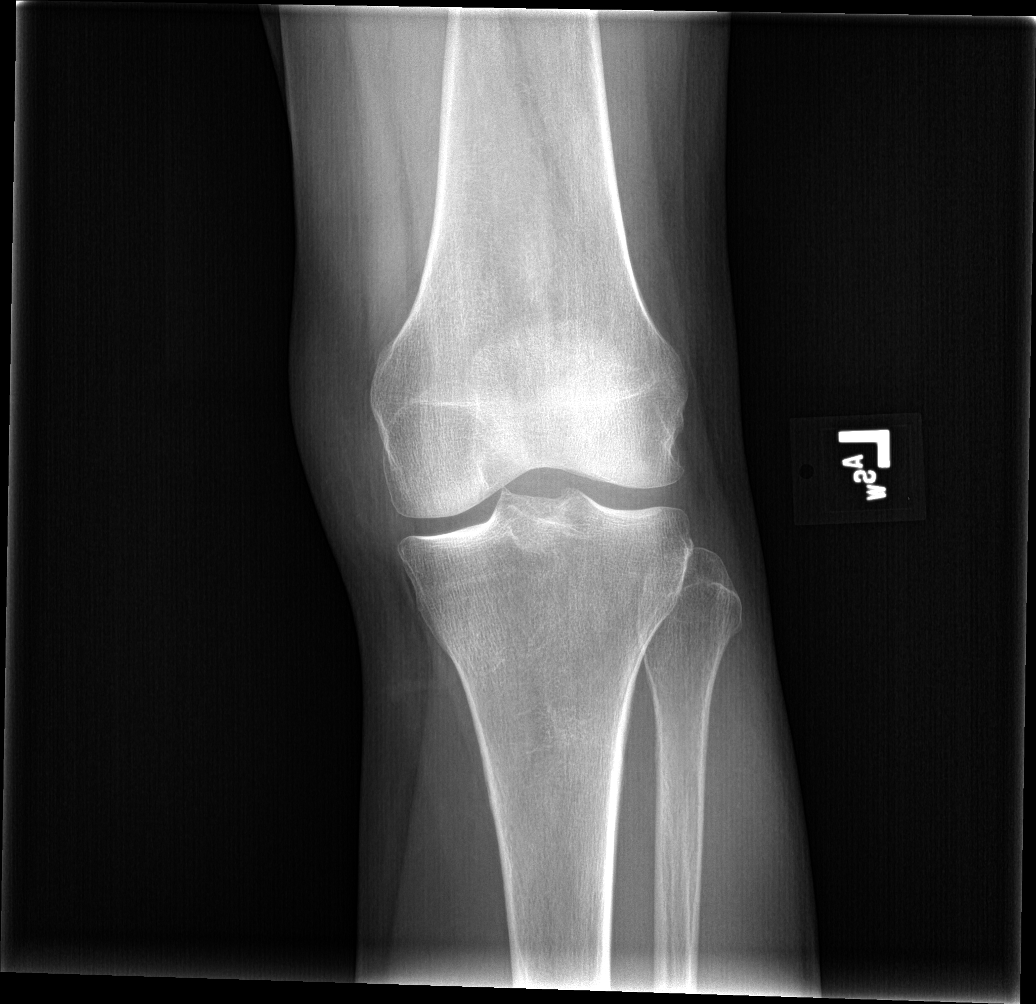

[patella skyline]
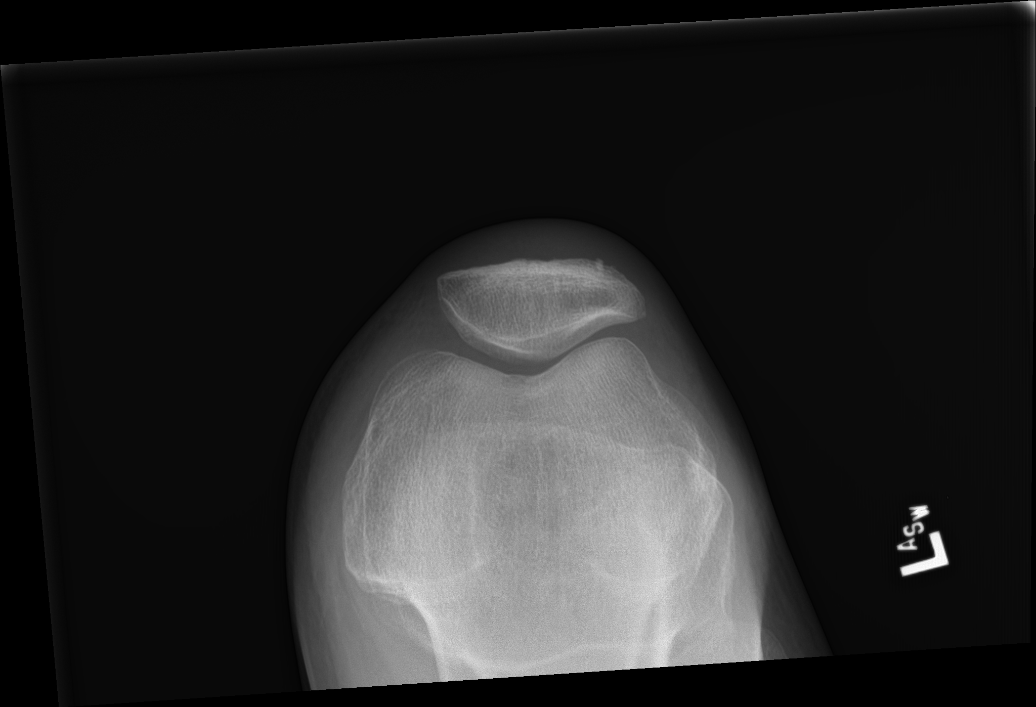

[knee ap]
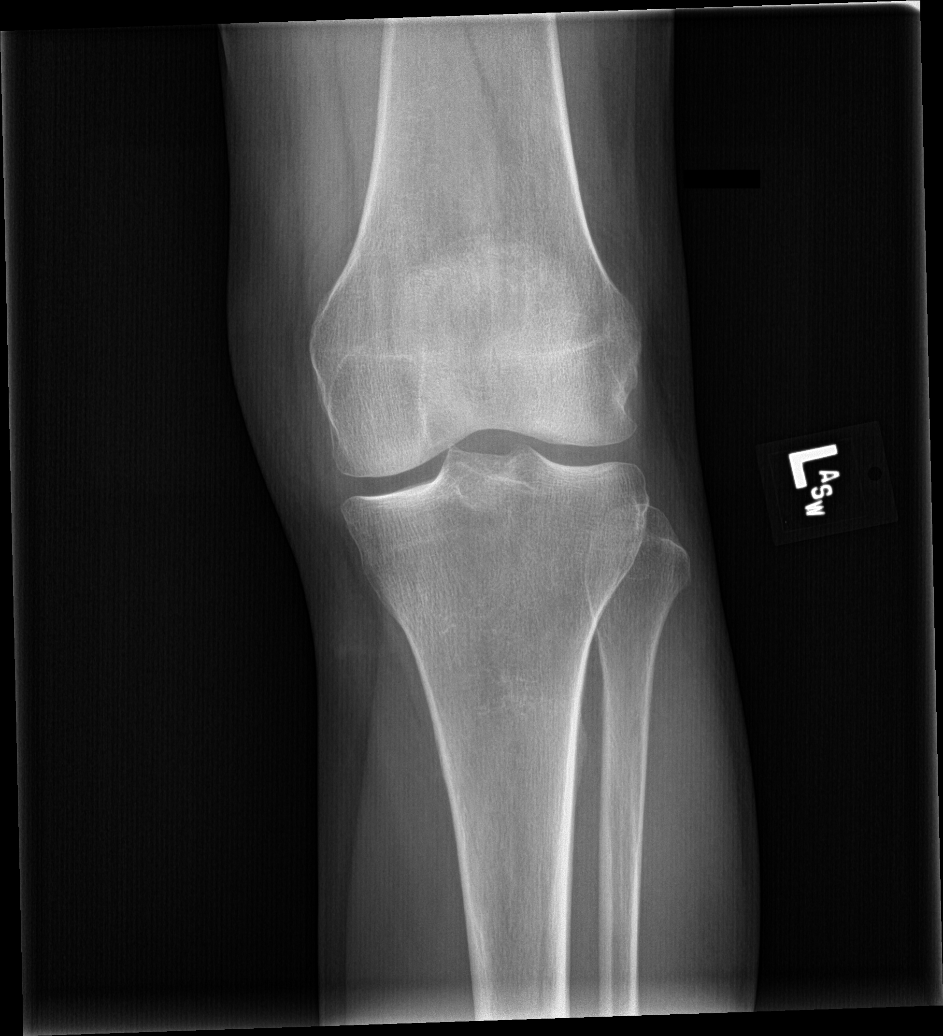

[4 of 4 positions shown; findings below may reference images not displayed]

FINDINGS: Normal alignment and joint spaces. No fracture, focal lesion,
periosteal thickening or bony destruction. No evidence of focal bone
lesion. Small knee joint effusion. There is prepatellar soft tissue
edema and thickening. No soft tissue air.
IMPRESSION: Prepatellar soft tissue edema and thickening, may represent
cellulitis or prepatellar bursitis. Small knee joint effusion. No
osseous abnormality.

## 2022-01-24 ENCOUNTER — Other Ambulatory Visit
Admission: RE | Admit: 2022-01-24 | Discharge: 2022-01-24 | Disposition: A | Discharge status: Home / Self Care | Payer: Medicare Other | Source: Ambulatory Visit | Attending: Ophthalmology | Admitting: Ophthalmology

## 2022-01-24 DIAGNOSIS — G7000 Myasthenia gravis without (acute) exacerbation: Secondary | ICD-10-CM | POA: Insufficient documentation

## 2022-02-09 LAB — MISC LABCORP TEST (SEND OUT)
Labcorp test code: 165605
Labcorp test code: 165620

## 2022-04-29 ENCOUNTER — Encounter: Payer: Self-pay | Admitting: Emergency Medicine

## 2022-04-29 ENCOUNTER — Ambulatory Visit
Admission: EM | Admit: 2022-04-29 | Discharge: 2022-04-29 | Disposition: A | Discharge status: Home / Self Care | Payer: Medicare Other | Attending: Family Medicine | Admitting: Family Medicine

## 2022-04-29 ENCOUNTER — Ambulatory Visit (INDEPENDENT_AMBULATORY_CARE_PROVIDER_SITE_OTHER): Payer: Medicare Other

## 2022-04-29 DIAGNOSIS — J189 Pneumonia, unspecified organism: Secondary | ICD-10-CM

## 2022-04-29 DIAGNOSIS — R509 Fever, unspecified: Secondary | ICD-10-CM | POA: Diagnosis not present

## 2022-04-29 DIAGNOSIS — R059 Cough, unspecified: Secondary | ICD-10-CM | POA: Diagnosis not present

## 2022-04-29 DIAGNOSIS — Z1152 Encounter for screening for COVID-19: Secondary | ICD-10-CM | POA: Diagnosis not present

## 2022-04-29 LAB — RESP PANEL BY RT-PCR (RSV, FLU A&B, COVID)  RVPGX2
Influenza A by PCR: NEGATIVE
Influenza B by PCR: NEGATIVE
Resp Syncytial Virus by PCR: NEGATIVE
SARS Coronavirus 2 by RT PCR: NEGATIVE

## 2022-04-29 MED ORDER — DOXYCYCLINE HYCLATE 100 MG PO CAPS: 100.0000 mg | ORAL_CAPSULE | Freq: Two times a day (BID) | ORAL | 0 refills | Status: AC

## 2022-04-29 MED ORDER — CEFDINIR 300 MG PO CAPS: 300.0000 mg | ORAL_CAPSULE | Freq: Two times a day (BID) | ORAL | 0 refills | Status: AC

## 2022-04-29 MED ORDER — PREDNISONE 10 MG (21) PO TBPK: ORAL_TABLET | Freq: Every day | ORAL | 0 refills | Status: DC

## 2022-04-29 NOTE — ED Provider Notes (Signed)
[facility]    CSN: [id number] Arrival date & time: 04/29/22  1114      History   Chief Complaint Chief Complaint  Patient presents with   Cough    HPI Savannah Mccarthy is a 61 y.o. female.   HPI   Savannah Mccarthy presents for cough since Wednesday. Fever 102.5 then 101.4 F.  She didn't any ibuprofen prior to arrival but took her medications for her back.  She had chills in the middle of the night and was sweating.  She has brain fog which is similar when she had COVID.  Took a COVID test yesterday which was negative.  Has chest congestion.  History of smoking tobacco and quit in May 2019.  No vomiting or diarrhea. Has congestion.       Past Medical History:  Diagnosis Date   Cervical back pain with evidence of disc disease     There are no problems to display for this patient.   Past Surgical History:  Procedure Laterality Date   ANAL FISSURE REPAIR     CESAREAN SECTION     NECK SURGERY     TONSILLECTOMY      OB History   No obstetric history on file.      Home Medications    Prior to Admission medications   Medication Sig Start Date End Date Taking? Authorizing Provider  baclofen (LIORESAL) 10 MG tablet Take 10 mg by mouth 2 (two) times daily.   Yes [provider]  cefdinir (OMNICEF) 300 MG capsule Take 1 capsule (300 mg total) by mouth 2 (two) times daily for 5 days. 04/29/22 05/04/22 Yes Brittyn Salaz, DO  doxycycline (VIBRAMYCIN) 100 MG capsule Take 1 capsule (100 mg total) by mouth 2 (two) times daily for 5 days. 04/29/22 05/04/22 Yes Erisha Paugh, DO  ibuprofen (ADVIL,MOTRIN) 600 MG tablet Take 600 mg by mouth every 6 (six) hours as needed.   Yes [provider]  morphine (MS CONTIN) 30 MG 12 hr tablet Take 30 mg by mouth every 12 (twelve) hours.   Yes [provider]  oxyCODONE-acetaminophen (PERCOCET) 10-325 MG tablet Take by mouth.   Yes [provider]  albuterol (PROVENTIL HFA;VENTOLIN HFA) 108 (90  Base) MCG/ACT inhaler Inhale 1-2 puffs into the lungs every 6 (six) hours as needed for wheezing or shortness of breath. 09/30/17 10/30/19  Norval Gable, MD  HYDROcodone-acetaminophen (NORCO) 10-325 MG per tablet Take 1 tablet by mouth every 4 (four) hours as needed.    [provider]  meloxicam (MOBIC) 15 MG tablet Take 1 tablet (15 mg total) by mouth daily. 10/30/19   Karen Kitchens, NP  predniSONE (STERAPRED UNI-PAK 21 TAB) 10 MG (21) TBPK tablet Take by mouth daily. 60 mg x 1 day, 50 mg x 1 day, 40 mg x 1 day, 30 mg x 1 day, 20 mg x 1 day, 10 mg x 1 day 04/29/22   Lyndee Hensen, DO    Family History Family History  Problem Relation Age of Onset   Cancer Mother    Hypertension Mother    COPD Mother    Cancer Father     Social History Social History   Tobacco Use   Smoking status: Former   Smokeless tobacco: Never  Scientific laboratory technician Use: Former  Substance Use Topics   Alcohol use: No   Drug use: Never    Types: Morphine     Allergies   Patient has no known allergies.  Review of Systems Review of Systems: negative unless otherwise stated in HPI.      Physical Exam Triage Vital Signs ED Triage Vitals  Enc Vitals Group     BP 04/29/22 1153 103/65     Pulse Rate 04/29/22 1153 71     Resp 04/29/22 1153 15     Temp 04/29/22 1153 97.6 F (36.4 C)     Temp Source 04/29/22 1153 Temporal     SpO2 04/29/22 1153 94 %     Weight 04/29/22 1150 126 lb 15.8 oz (57.6 kg)     Height 04/29/22 1150 5' 6.5" (1.689 m)     Head Circumference --      Peak Flow --      Pain Score 04/29/22 1150 5     Pain Loc --      Pain Edu? --      Excl. in GC? --    No data found.  Updated Vital Signs BP 103/65 (BP Location: Right Arm)   Pulse 71   Temp 97.6 F (36.4 C) (Temporal) Comment: patient drink cold water  Resp 15   Ht 5' 6.5" (1.689 m)   Wt 57.6 kg   SpO2 94%   BMI 20.19 kg/m   Visual Acuity Right Eye Distance:   Left Eye Distance:   Bilateral Distance:     Right Eye Near:   Left Eye Near:    Bilateral Near:     Physical Exam GEN:     alert, non-toxic appearing female in no distress    HENT:  mucus membranes moist, oropharyngeal without lesions or exudate, no tonsillar hypertrophy,  mild oropharyngeal erythema, no nasal discharge EYES:   pupils equal and reactive, no scleral injection or discharge NECK:  normal ROM RESP:  no increased work of breathing, right mid lung rales and expiratory wheezing, no rhonchi CVS:   regular rate and rhythm Skin:   warm and dry, no rash on visible skin    UC Treatments / Results  Labs (all labs ordered are listed, but only abnormal results are displayed) Labs Reviewed  RESP PANEL BY RT-PCR (RSV, FLU A&B, COVID)  [id number]    EKG   Radiology DG Chest 2 View  Result Date: 04/29/2022 CLINICAL DATA:  Cough, fever EXAM: CHEST - 2 VIEW COMPARISON:  09/30/2017 FINDINGS: The heart size and mediastinal contours are within normal limits. Hyperinflated lungs. Right middle lobe airspace consolidation. Left lung is clear. No pleural effusion or pneumothorax. The visualized skeletal structures are unremarkable. IMPRESSION: Right middle lobe pneumonia. Follow-up to resolution recommended. Electronically Signed   By: Duanne Guess D.O.   On: 04/29/2022 12:25    Procedures Procedures (including critical care time)  Medications Ordered in UC Medications - No data to display  Initial Impression / Assessment and Plan / UC Course  I have reviewed the triage vital signs and the nursing notes.  Pertinent labs & imaging results that were available during my care of the patient were reviewed by me and considered in my medical decision making (see chart for details).       Pt is a 61 y.o. female former smoker who presents for 4 days of cough and fever. Savannah Mccarthy is afebrile here without recent antipyretics. Satting well on room air. Overall pt is non-toxic appearing, well hydrated, without respiratory distress.  On  chart review in May 2019, her chest x-ray suggested COPD.  She has history of 30-year pack year smoking.  At that time she had a  posterior right costophrenic pneumonia.  Pulmonary exam is remarkable for right middle lung rales and expiratory wheezing.  RSV, COVID and influenza testing obtained and were negative.  Chest x-ray obtained which was concerning for a right middle lobe pneumonia.  Radiologist recommended patient have repeat to ensure resolution.  Patient updated on results.  We will treat her for COPD exacerbation as well as pneumonia with antibiotics and steroids as below.  Offered cough syrup but patient declined. Typical duration of symptoms discussed.  Recommended patient repeat her chest x-ray in about 4 to 6 weeks and she voiced understanding.  Return and ED precautions given and patient/guardian voiced understanding. Discussed MDM, treatment plan and plan for follow-up with patient who agrees with plan.     Final Clinical Impressions(s) / UC Diagnoses   Final diagnoses:  Pneumonia of right middle lobe due to infectious organism     Discharge Instructions      Your chest x-ray showed a pneumonia.  Stop by the pharmacy to pick up your prescriptions.  It is important that she get a follow-up x-ray as recommended by the radiologist.     ED Prescriptions     Medication Sig Dispense Auth. Provider   cefdinir (OMNICEF) 300 MG capsule Take 1 capsule (300 mg total) by mouth 2 (two) times daily for 5 days. 10 capsule Cassell Voorhies, DO   doxycycline (VIBRAMYCIN) 100 MG capsule Take 1 capsule (100 mg total) by mouth 2 (two) times daily for 5 days. 10 capsule Raylan Hanton, DO   predniSONE (STERAPRED UNI-PAK 21 TAB) 10 MG (21) TBPK tablet Take by mouth daily. 60 mg x 1 day, 50 mg x 1 day, 40 mg x 1 day, 30 mg x 1 day, 20 mg x 1 day, 10 mg x 1 day 21 tablet Kameryn Davern, DO      PDMP not reviewed this encounter.   Katha Cabal, DO 04/29/22 1433

## 2022-04-29 NOTE — Discharge Instructions (Addendum)
Your chest x-ray showed a pneumonia.  Stop by the pharmacy to pick up your prescriptions.  It is important that she get a follow-up x-ray as recommended by the radiologist.

## 2022-04-29 NOTE — ED Triage Notes (Signed)
Patient c/o cough, chest congestion, fevers, chills that started Tuesday.  Patient took a home covid test yesterday and was negative.

## 2023-08-06 ENCOUNTER — Ambulatory Visit
Admission: EM | Admit: 2023-08-06 | Discharge: 2023-08-06 | Disposition: A | Discharge status: Home / Self Care | Attending: Emergency Medicine | Admitting: Emergency Medicine

## 2023-08-06 ENCOUNTER — Encounter: Payer: Self-pay | Admitting: Emergency Medicine

## 2023-08-06 ENCOUNTER — Ambulatory Visit (INDEPENDENT_AMBULATORY_CARE_PROVIDER_SITE_OTHER)

## 2023-08-06 DIAGNOSIS — R051 Acute cough: Secondary | ICD-10-CM

## 2023-08-06 DIAGNOSIS — J189 Pneumonia, unspecified organism: Secondary | ICD-10-CM

## 2023-08-06 MED ORDER — ALBUTEROL SULFATE HFA 108 (90 BASE) MCG/ACT IN AERS: 1.0000 | INHALATION_SPRAY | RESPIRATORY_TRACT | 0 refills | Status: AC | PRN

## 2023-08-06 MED ORDER — AZITHROMYCIN 250 MG PO TABS: 250.0000 mg | ORAL_TABLET | Freq: Every day | ORAL | 0 refills | Status: AC

## 2023-08-06 MED ORDER — AEROCHAMBER MV MISC: 1 refills | Status: AC

## 2023-08-06 MED ORDER — AMOXICILLIN-POT CLAVULANATE 875-125 MG PO TABS: 1.0000 | ORAL_TABLET | Freq: Two times a day (BID) | ORAL | 0 refills | Status: AC

## 2023-08-06 MED ORDER — PREDNISONE 20 MG PO TABS: 40.0000 mg | ORAL_TABLET | Freq: Every day | ORAL | 0 refills | Status: AC

## 2023-08-06 MED ORDER — PROMETHAZINE-DM 6.25-15 MG/5ML PO SYRP: 5.0000 mL | ORAL_SOLUTION | Freq: Four times a day (QID) | ORAL | 0 refills | Status: AC | PRN

## 2023-08-06 NOTE — ED Triage Notes (Signed)
 Pt c/o cough,congestion,wheezing & sob x4 days. Denies any hx of asthma or COPD.

## 2023-08-06 NOTE — Discharge Instructions (Addendum)
 I am concerned that you have a pneumonia in your left lung, and possible in your right lung.  I am sending you home with azithromycin and Augmentin for this.  Finish them, even if you feel better.  We will contact you if the radiology overread differs enough from mine and we need to change management.  Take two puffs from your albuterol inhaler with your spacer every 4 hours for 2 days, then every 6 hours for 2 days, then as needed. You can back off if you start to improve  sooner. Finish the steroids unless your doctor tells you to stop.  Try the Tessalon for cough, and if that does not work, then the Promethazine DM should help. Take tylenol 1 gram combined with  600 mg of motrin up to 3-4 times a day as needed for pain.  Do not exceed 4000 mg of Tylenol from all sources in 24 hours.  Make sure you drink extra fluids.   Go to the ER if you get worse, have a fever >100.4, a consistent oxygen saturation of below 90%,  or any other concerns.   If the spacer is too expensive at the pharmacy, you can get an AeroChamber Z-Stat off of [employer] for about $10-$15.  Go to www.goodrx.com  or www.costplusdrugs.com to look up your medications. This will give you a list of where you can find your prescriptions at the most affordable prices. Or ask the pharmacist what the cash price is, or if they have any other discount programs available to help make your medication more affordable. This can be less expensive than what you would pay with insurance.

## 2023-08-06 NOTE — ED Provider Notes (Signed)
 HPI  SUBJECTIVE:  Savannah Mccarthy is a 63 y.o. female who presents with 4 days of itchy ears, scratchy throat and 3 days of a cough productive of mucus, chest congestion, substernal burning constant chest pain described as soreness, headache, wheezing, shortness of breath.  She states that she got worse yesterday, coughing all night with chills.  She states that her pulse ox was around 88% consistently, normal O2 sat for her is 95 to 96%.  No fevers, nasal congestion, rhinorrhea, postnasal drip, sinus pain or pressure, dyspnea on exertion.  No antibiotics in the past 3 months.  She took ibuprofen within the past 6 hours without improvement in her symptoms.  She is taking ibuprofen 600 mg every 6 hours, has also tried vitamin C and cough drops without relief.  Symptoms are worse with coughing.  She has a past medical history of recurrent pneumonia, and is a former smoker with a 30-year pack history.  She quit 5 to 6 years ago.  She reports chronic back pain being managed by a pain clinic and is on MS Contin, Percocet and baclofen.  No history of pulmonary disease, diabetes, PE, cancer.  PCP: [facility]..    Past Medical History:  Diagnosis Date   Cervical back pain with evidence of disc disease     Past Surgical History:  Procedure Laterality Date   ANAL FISSURE REPAIR     CESAREAN SECTION     NECK SURGERY     TONSILLECTOMY      Family History  Problem Relation Age of Onset   Cancer Mother    Hypertension Mother    COPD Mother    Cancer Father     Social History   Tobacco Use   Smoking status: Former   Smokeless tobacco: Never  Advertising account planner   Vaping status: Former  Substance Use Topics   Alcohol use: No   Drug use: Never    Types: Morphine    No current facility-administered medications for this encounter.  Current Outpatient Medications:    albuterol (VENTOLIN HFA) 108 (90 Base) MCG/ACT inhaler, Inhale 1-2 puffs into the lungs every 4 (four) hours as needed  for wheezing or shortness of breath., Disp: 1 each, Rfl: 0   amoxicillin-clavulanate (AUGMENTIN) 875-125 MG tablet, Take 1 tablet by mouth every 12 (twelve) hours for 5 days., Disp: 10 tablet, Rfl: 0   azithromycin (ZITHROMAX) 250 MG tablet, Take 1 tablet (250 mg total) by mouth daily. 2 tabs po on day 1, 1 tab po on days 2-5, Disp: 6 tablet, Rfl: 0   baclofen (LIORESAL) 10 MG tablet, Take 10 mg by mouth 2 (two) times daily., Disp: , Rfl:    HYDROcodone-acetaminophen (NORCO) 10-325 MG per tablet, Take 1 tablet by mouth every 4 (four) hours as needed., Disp: , Rfl:    ibuprofen (ADVIL,MOTRIN) 600 MG tablet, Take 600 mg by mouth every 6 (six) hours as needed., Disp: , Rfl:    morphine (MS CONTIN) 30 MG 12 hr tablet, Take 30 mg by mouth every 12 (twelve) hours., Disp: , Rfl:    oxyCODONE-acetaminophen (PERCOCET) 10-325 MG tablet, Take by mouth., Disp: , Rfl:    predniSONE (DELTASONE) 20 MG tablet, Take 2 tablets (40 mg total) by mouth daily with breakfast for 5 days., Disp: 10 tablet, Rfl: 0   promethazine-dextromethorphan (PROMETHAZINE-DM) 6.25-15 MG/5ML syrup, Take 5 mLs by mouth 4 (four) times daily as needed for cough., Disp: 118 mL, Rfl: 0   Spacer/Aero-Holding Chambers (AEROCHAMBER MV)  inhaler, Use as instructed, Disp: 1 each, Rfl: 1   tiZANidine (ZANAFLEX) 4 MG tablet, Take one tablet (4 mg dose) by mouth 3 (three) times a day as needed for up to 30 days., Disp: , Rfl:   No Known Allergies   ROS  As noted in HPI.   Physical Exam  BP 106/76 (BP Location: Left Arm)   Pulse 88   Temp 98.2 F (36.8 C) (Oral)   Resp 16   Ht 5\' 5"  (1.651 m)   Wt 59 kg   SpO2 95%   BMI 21.63 kg/m   Constitutional: Well developed, well nourished, no acute distress coughing. Eyes: PERRL, EOMI, conjunctiva normal bilaterally HENT: Normocephalic, atraumatic,mucus membranes moist.  No nasal congestion.  No maxillary, frontal sinus tenderness Respiratory: Clear to auscultation bilaterally, no rales, no  wheezing, no rhonchi.  Fair air movement.  Positive anterior chest wall tenderness Cardiovascular: Normal rate and rhythm, no murmurs, no gallops, no rubs GI: Nondistended skin: No rash, skin intact Musculoskeletal:  no deformities Neurologic: Alert & oriented x 3, CN III-XII grossly intact, no motor deficits, sensation grossly intact Psychiatric: Speech and behavior appropriate   ED Course   Medications - No data to display  Orders Placed This Encounter  Procedures   DG Chest 2 View    Standing Status:   Standing    Number of Occurrences:   1    Reason for Exam (SYMPTOM  OR DIAGNOSIS REQUIRED):   cough 3 days, ex smoker.  h/o frequent PNA r/o PNA   No results found for this or any previous visit (from the past 24 hours). DG Chest 2 View Result Date: 08/06/2023 CLINICAL DATA:  Cough for 3 days.  Congestion and wheezing. EXAM: CHEST - 2 VIEW COMPARISON:  04/29/2022. FINDINGS: Trachea is midline. Heart size normal. Streaky scarring in the lung bases. No airspace consolidation or pleural fluid. Degenerative changes in the spine. IMPRESSION: No acute findings. Electronically Signed   By: Leanna Battles M.D.   On: 08/06/2023 16:38    ED Clinical Impression  1. Community acquired pneumonia, unspecified laterality   2. Acute cough      ED Assessment/Plan     Patient presents with an acute cough.  Will check chest x-ray as she gets frequent pneumonias.   Reviewed imaging independently.  Blurring of the left heart border and possible right middle lobe pneumonia as read by me formal radiology overread pending.  Discussed this with patient.  Will contact patient at [phone] if radiology overread differs enough from mine and we need to change management   Will send home with Promethazine DM as she is already on multiple narcotic medications for her chronic back pain, prednisone 40 mg for 5 days, regularly scheduled albuterol inhaler with a spacer for 4 days, then as needed  thereafter.  Tessalon as she states cough syrups trigger off a gag reflex.  Will send home with a prescription of Promethazine DM in case the Tessalon does not work.  Home with Augmentin, azithromycin for possible pneumonia.  Reviewed radiology report.  Streaky scarring in the lung bases.  No airspace consolidation or pleural fluid.  No acute cardiopulmonary disease per radiology see radiology report for full details.  Will send patient MyChart note giving her the option to wait to try the antibiotics for now.  She is to start them if they get worse, she starts having fevers.  She is to continue the rest of the plan as outlined above  Discussed imaging,  MDM, treatment plan, and plan for follow-up with patient Discussed sn/sx that should prompt return to the ED. patient agrees with plan.   Meds ordered this encounter  Medications   albuterol (VENTOLIN HFA) 108 (90 Base) MCG/ACT inhaler    Sig: Inhale 1-2 puffs into the lungs every 4 (four) hours as needed for wheezing or shortness of breath.    Dispense:  1 each    Refill:  0   predniSONE (DELTASONE) 20 MG tablet    Sig: Take 2 tablets (40 mg total) by mouth daily with breakfast for 5 days.    Dispense:  10 tablet    Refill:  0   promethazine-dextromethorphan (PROMETHAZINE-DM) 6.25-15 MG/5ML syrup    Sig: Take 5 mLs by mouth 4 (four) times daily as needed for cough.    Dispense:  118 mL    Refill:  0   Spacer/Aero-Holding Chambers (AEROCHAMBER MV) inhaler    Sig: Use as instructed    Dispense:  1 each    Refill:  1   azithromycin (ZITHROMAX) 250 MG tablet    Sig: Take 1 tablet (250 mg total) by mouth daily. 2 tabs po on day 1, 1 tab po on days 2-5    Dispense:  6 tablet    Refill:  0   amoxicillin-clavulanate (AUGMENTIN) 875-125 MG tablet    Sig: Take 1 tablet by mouth every 12 (twelve) hours for 5 days.    Dispense:  10 tablet    Refill:  0      *This clinic note was created using Scientist, clinical (histocompatibility and immunogenetics). Therefore, there may  be occasional mistakes despite careful proofreading. ?    Domenick Gong, MD 08/10/23 1545
